# Patient Record
Sex: Male | Born: 2000 | Race: White | Hispanic: No | Marital: Single | Smoking: Never smoker
Health system: Southern US, Community
[De-identification: ages and names within clinical notes are randomized; demographics above are authoritative.]

## PROBLEM LIST (undated history)

## (undated) ENCOUNTER — Emergency Department (HOSPITAL_COMMUNITY): Admission: EM | Discharge status: Absent without leave | Payer: Self-pay

## (undated) DIAGNOSIS — J020 Streptococcal pharyngitis: Secondary | ICD-10-CM

---
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
## 2023-08-09 ENCOUNTER — Other Ambulatory Visit (HOSPITAL_COMMUNITY): Payer: Self-pay

## 2023-08-09 MED ORDER — ACETAMINOPHEN-CODEINE 300-30 MG PO TABS
1.0000 | ORAL_TABLET | Freq: Four times a day (QID) | ORAL | 0 refills | Status: AC | PRN
  Filled 2023-08-09: qty 20, 5d supply, fill #0

## 2023-08-09 MED ORDER — AMOXICILLIN 500 MG PO CAPS
500.0000 mg | ORAL_CAPSULE | Freq: Three times a day (TID) | ORAL | 0 refills | Status: AC
  Filled 2023-08-09: qty 21, 7d supply, fill #0
